# Patient Record
Sex: Male | Born: 1955 | Race: White | Hispanic: No | Marital: Married
Health system: Southern US, Community
[De-identification: ages and names within clinical notes are randomized; demographics above are authoritative.]

---

## 2021-05-16 ENCOUNTER — Emergency Department (HOSPITAL_COMMUNITY): Payer: Medicare Other

## 2021-05-16 ENCOUNTER — Encounter (HOSPITAL_COMMUNITY): Payer: Self-pay | Admitting: Emergency Medicine

## 2021-05-16 ENCOUNTER — Other Ambulatory Visit: Payer: Self-pay

## 2021-05-16 ENCOUNTER — Emergency Department (HOSPITAL_COMMUNITY)
Admission: EM | Admit: 2021-05-16 | Discharge: 2021-05-16 | Disposition: A | Discharge status: Home / Self Care | Payer: Medicare Other | Attending: Emergency Medicine | Admitting: Emergency Medicine

## 2021-05-16 DIAGNOSIS — Z7901 Long term (current) use of anticoagulants: Secondary | ICD-10-CM | POA: Insufficient documentation

## 2021-05-16 DIAGNOSIS — S0990XA Unspecified injury of head, initial encounter: Secondary | ICD-10-CM | POA: Diagnosis not present

## 2021-05-16 DIAGNOSIS — R413 Other amnesia: Secondary | ICD-10-CM | POA: Diagnosis not present

## 2021-05-16 DIAGNOSIS — R55 Syncope and collapse: Secondary | ICD-10-CM | POA: Diagnosis not present

## 2021-05-16 DIAGNOSIS — Y900 Blood alcohol level of less than 20 mg/100 ml: Secondary | ICD-10-CM | POA: Insufficient documentation

## 2021-05-16 DIAGNOSIS — R079 Chest pain, unspecified: Secondary | ICD-10-CM | POA: Diagnosis not present

## 2021-05-16 DIAGNOSIS — Z79899 Other long term (current) drug therapy: Secondary | ICD-10-CM | POA: Diagnosis not present

## 2021-05-16 DIAGNOSIS — R519 Headache, unspecified: Secondary | ICD-10-CM | POA: Diagnosis not present

## 2021-05-16 DIAGNOSIS — R0789 Other chest pain: Secondary | ICD-10-CM | POA: Diagnosis not present

## 2021-05-16 DIAGNOSIS — W19XXXA Unspecified fall, initial encounter: Secondary | ICD-10-CM

## 2021-05-16 DIAGNOSIS — Y92009 Unspecified place in unspecified non-institutional (private) residence as the place of occurrence of the external cause: Secondary | ICD-10-CM | POA: Diagnosis not present

## 2021-05-16 DIAGNOSIS — Z20822 Contact with and (suspected) exposure to covid-19: Secondary | ICD-10-CM | POA: Diagnosis not present

## 2021-05-16 DIAGNOSIS — W11XXXA Fall on and from ladder, initial encounter: Secondary | ICD-10-CM | POA: Insufficient documentation

## 2021-05-16 DIAGNOSIS — S3991XA Unspecified injury of abdomen, initial encounter: Secondary | ICD-10-CM | POA: Diagnosis not present

## 2021-05-16 LAB — CBC
HCT: 42.7 % (ref 39.0–52.0)
Hemoglobin: 14.3 g/dL (ref 13.0–17.0)
MCH: 31.8 pg (ref 26.0–34.0)
MCHC: 33.5 g/dL (ref 30.0–36.0)
MCV: 94.9 fL (ref 80.0–100.0)
Platelets: 284 10*3/uL (ref 150–400)
RBC: 4.5 MIL/uL (ref 4.22–5.81)
RDW: 14 % (ref 11.5–15.5)
WBC: 9.6 10*3/uL (ref 4.0–10.5)
nRBC: 0 % (ref 0.0–0.2)

## 2021-05-16 LAB — I-STAT CHEM 8, ED
BUN: 22 mg/dL (ref 8–23)
Calcium, Ion: 1.16 mmol/L (ref 1.15–1.40)
Chloride: 100 mmol/L (ref 98–111)
Creatinine, Ser: 1.1 mg/dL (ref 0.61–1.24)
Glucose, Bld: 112 mg/dL — ABNORMAL HIGH (ref 70–99)
HCT: 43 % (ref 39.0–52.0)
Hemoglobin: 14.6 g/dL (ref 13.0–17.0)
Potassium: 3.6 mmol/L (ref 3.5–5.1)
Sodium: 138 mmol/L (ref 135–145)
TCO2: 30 mmol/L (ref 22–32)

## 2021-05-16 LAB — ETHANOL: Alcohol, Ethyl (B): <10 mg/dL (ref <10)

## 2021-05-16 LAB — RESP PANEL BY RT-PCR (FLU A&B, COVID) ARPGX2
Influenza A by PCR: NEGATIVE
Influenza B by PCR: NEGATIVE
SARS Coronavirus 2 by RT PCR: NEGATIVE

## 2021-05-16 LAB — PROTIME-INR
INR: 1.3 — ABNORMAL HIGH (ref 0.8–1.2)
Prothrombin Time: 15.7 seconds — ABNORMAL HIGH (ref 11.4–15.2)

## 2021-05-16 LAB — SAMPLE TO BLOOD BANK

## 2021-05-16 LAB — COMPREHENSIVE METABOLIC PANEL
ALT: 35 U/L (ref 0–44)
AST: 36 U/L (ref 15–41)
Albumin: 3.9 g/dL (ref 3.5–5.0)
Alkaline Phosphatase: 67 U/L (ref 38–126)
Anion gap: 10 (ref 5–15)
BUN: 20 mg/dL (ref 8–23)
CO2: 26 mmol/L (ref 22–32)
Calcium: 9.4 mg/dL (ref 8.9–10.3)
Chloride: 100 mmol/L (ref 98–111)
Creatinine, Ser: 1.21 mg/dL (ref 0.61–1.24)
GFR, Estimated: >60 mL/min (ref >60)
Glucose, Bld: 112 mg/dL — ABNORMAL HIGH (ref 70–99)
Potassium: 3.8 mmol/L (ref 3.5–5.1)
Sodium: 136 mmol/L (ref 135–145)
Total Bilirubin: 1 mg/dL (ref 0.3–1.2)
Total Protein: 6.8 g/dL (ref 6.5–8.1)

## 2021-05-16 LAB — LACTIC ACID, PLASMA: Lactic Acid, Venous: 1.5 mmol/L (ref 0.5–1.9)

## 2021-05-16 MED ORDER — CYCLOBENZAPRINE HCL 5 MG PO TABS: 5.0000 mg | ORAL_TABLET | Freq: Three times a day (TID) | ORAL | 0 refills | Status: AC | PRN

## 2021-05-16 MED ORDER — IOHEXOL 300 MG/ML  SOLN
100.0000 mL | Freq: Once | INTRAMUSCULAR | Status: AC | PRN
  Administered 2021-05-16: 100 mL via INTRAVENOUS

## 2021-05-16 MED ORDER — OXYCODONE-ACETAMINOPHEN 5-325 MG PO TABS
2.0000 | ORAL_TABLET | Freq: Once | ORAL | Status: AC
Start: 2021-05-16 — End: 2021-05-16
  Administered 2021-05-16: 2 via ORAL
  Filled 2021-05-16: qty 2

## 2021-05-16 MED ORDER — ONDANSETRON 4 MG PO TBDP
4.0000 mg | ORAL_TABLET | Freq: Once | ORAL | Status: AC
  Administered 2021-05-16: 4 mg via ORAL
  Filled 2021-05-16: qty 1

## 2021-05-16 NOTE — ED Notes (Signed)
RN called CT, waiting istat results, no scanner available.

## 2021-05-16 NOTE — ED Notes (Signed)
X-ray at bedside

## 2021-05-16 NOTE — ED Provider Notes (Addendum)
[facility] EMERGENCY DEPARTMENT Provider Note   CSN: [id number] Arrival date & time: 05/16/21  1421     History Chief Complaint  Patient presents with   Cesareo Vickrey is a 65 y.o. male.  HPI  65 year old male who is reported to be on anticoagulation presents emergency department as a level 2 fall from a 6 feet off of a ladder.  He reportedly landed on his right side over a fence and then fell to the ground.  Positive head injury, positive reported loss of consciousness.  Patient has amnesia in regards to the event.  Complaining of right-sided head and right-sided chest pain.  History reviewed. No pertinent past medical history.  There are no problems to display for this patient.   History reviewed. No pertinent surgical history.     History reviewed. No pertinent family history.  Social History   Substance Use Topics   Alcohol use: Not Currently   Drug use: Never    Home Medications Prior to Admission medications   Not on File    Allergies    Patient has no known allergies.  Review of Systems   Review of Systems  HENT:  Negative for trouble swallowing and voice change.   Eyes:  Negative for visual disturbance.  Respiratory:  Negative for shortness of breath.   Cardiovascular:  Positive for chest pain.  Gastrointestinal:  Negative for abdominal pain.  Genitourinary:  Negative for difficulty urinating.  Musculoskeletal:  Positive for neck pain. Negative for back pain.  Neurological:  Positive for headaches.  Psychiatric/Behavioral:  Negative for confusion.    Physical Exam Updated Vital Signs BP 119/66   Pulse 72   Temp 98.5 F (36.9 C) (Temporal)   Resp 20   Ht 6' (1.829 m)   Wt 102.1 kg   SpO2 93%   BMI 30.52 kg/m   Physical Exam Vitals and nursing note reviewed.  Constitutional:      General: He is not in acute distress. HENT:     Head: Normocephalic.     Comments: Midface is stable    Right Ear: External ear  normal.     Left Ear: External ear normal.     Nose: Nose normal.     Comments: No septal hematoma Eyes:     Extraocular Movements: Extraocular movements intact.     Conjunctiva/sclera: Conjunctivae normal.     Pupils: Pupils are equal, round, and reactive to light.  Neck:     Comments: Cervical collar in place Cardiovascular:     Rate and Rhythm: Normal rate.  Pulmonary:     Effort: Pulmonary effort is normal.  Abdominal:     General: Abdomen is flat. There is no distension.     Palpations: Abdomen is soft.     Comments: No seat belt sign  Musculoskeletal:        General: No deformity or signs of injury.     Cervical back: No tenderness.     Comments: Pelvis is stable  Skin:    General: Skin is warm.  Neurological:     Mental Status: He is alert and oriented to person, place, and time.    ED Results / Procedures / Treatments   Labs (all labs ordered are listed, but only abnormal results are displayed) Labs Reviewed  I-STAT CHEM 8, ED - Abnormal; Notable for the following components:      Result Value   Glucose, Bld 112 (*)    All other  components within normal limits  RESP PANEL BY RT-PCR (FLU A&B, COVID) ARPGX2  LACTIC ACID, PLASMA  COMPREHENSIVE METABOLIC PANEL  CBC  ETHANOL  URINALYSIS, ROUTINE W REFLEX MICROSCOPIC  PROTIME-INR  SAMPLE TO BLOOD BANK    EKG None  Radiology DG Chest Port 1 View  Result Date: 05/16/2021 CLINICAL DATA:  Level 2 trauma.  Fell from a ladder. EXAM: PORTABLE CHEST 1 VIEW COMPARISON:  None. FINDINGS: Artifact overlies the chest. Heart size is normal. Allowing for technical factors, the lungs are probably clear. No pneumothorax or hemothorax. No visible regional fracture. IMPRESSION: Technical limitations.  No traumatic finding. Electronically Signed   By: Paulina Fusi M.D.   On: 05/16/2021 15:09    Procedures Ultrasound ED FAST  Date/Time: 05/16/2021 3:13 PM Performed by: Rozelle Logan, DO Authorized by: Rozelle Logan, DO   Procedure details:    Indications: blunt abdominal trauma and blunt chest trauma       Assess for:  Intra-abdominal fluid, pericardial effusion and pneumothorax    Technique:  Abdominal, cardiac and chest    Images: archived      Abdominal findings:    L kidney:  Visualized   R kidney:  Visualized   Liver:  Visualized    Bladder:  Visualized, Foley catheter not visualized   Hepatorenal space visualized: identified     Splenorenal space: identified     Rectovesical free fluid: not identified     Splenorenal free fluid: not identified     Hepatorenal space free fluid: not identified   Cardiac findings:    Heart:  Visualized   Wall motion: identified     Pericardial effusion: not identified   Chest findings:    L lung sliding: identified     R lung sliding: identified     Fluid in thorax: not identified   .Critical Care  Date/Time: 05/16/2021 3:13 PM Performed by: Rozelle Logan, DO Authorized by: Rozelle Logan, DO   Critical care provider statement:    Critical care time (minutes):  45   Critical care was necessary to treat or prevent imminent or life-threatening deterioration of the following conditions:  Trauma   Critical care was time spent personally by me on the following activities:  Discussions with consultants, evaluation of patient's response to treatment, examination of patient, ordering and performing treatments and interventions, ordering and review of laboratory studies, ordering and review of radiographic studies, pulse oximetry, re-evaluation of patient's condition, obtaining history from patient or surrogate and review of old charts   I assumed direction of critical care for this patient from another provider in my specialty: no     Medications Ordered in ED Medications - No data to display  ED Course  I have reviewed the triage vital signs and the nursing notes.  Pertinent labs & imaging results that were available during my care of the patient were  reviewed by me and considered in my medical decision making (see chart for details).    MDM Rules/Calculators/A&P                           65 year old male presents to the emergency department as a level 2 trauma for fall on blood thinners.  Report is that patient was up on a ladder, fell approximately 6 feet down onto his right side on a fence, positive head injury, positive loss of consciousness and amnesia.  Turns out the patient does not take any anticoagulation.  Vital signs are stable, fast sign is negative.  Chest and pelvis x-ray showed no acute pathology.  Ankle x-ray shows no fracture.  CT of the head, face, neck, chest abdomen pelvis identifies no acute traumatic injury.  I was able to clear the patient's C-spine at bedside.  Patient will be ambulated and discharged.  Patient at this time appears safe and stable for discharge and will be treated as an outpatient.  Discharge plan and strict return to ED precautions discussed, patient verbalizes understanding and agreement.  Final Clinical Impression(s) / ED Diagnoses Final diagnoses:  Fall    Rx / DC Orders ED Discharge Orders     None        Rozelle Logan, DO 05/16/21 1514    Hidaya Daniel, Clabe Seal, DO 05/16/21 1603

## 2021-05-16 NOTE — ED Notes (Signed)
RN called CT, scanner not available right now

## 2021-05-16 NOTE — Progress Notes (Signed)
Orthopedic Tech Progress Note Patient Details:  Davione Lenker Aug 27, 1956 [id number] Level 2 Trauma  Patient ID: Adalberto Ill, male   DOB: 09/27/1956, 65 y.o.   MRN: [id number]  Smitty Pluck 05/16/2021, 2:39 PM

## 2021-05-16 NOTE — Discharge Instructions (Addendum)
You have been seen and discharged from the emergency department.  Your x-rays and CAT scans were negative.  You are going to be sore.  Rest, take Tylenol/ibuprofen as needed for pain control.  Ice sore areas.  Follow-up with your primary provider for reevaluation and further care. Take home medications as prescribed. If you have any worsening symptoms or further concerns for your health please return to an emergency department for further evaluation.

## 2021-05-16 NOTE — ED Triage Notes (Signed)
Pt arrives as level 2 trauma for fall from 6-8 ft ladder via [facility]. Pt was at daughter's house, had witnessed fall off ladder, landed on a baby gate and hardwood onto R side, + LOC. Initial GCS 14, pt only alert to self. VSS, 18g LAC

## 2022-07-23 IMAGING — DX DG ANKLE PORT 2V*R*
3 series · 3 of 3 positions shown · non-contrast
Comparison: None.

CLINICAL DATA: Fell from a ladder.  Ankle pain.

EXAM:
PORTABLE RIGHT ANKLE - 2 VIEW

[ankle ap]
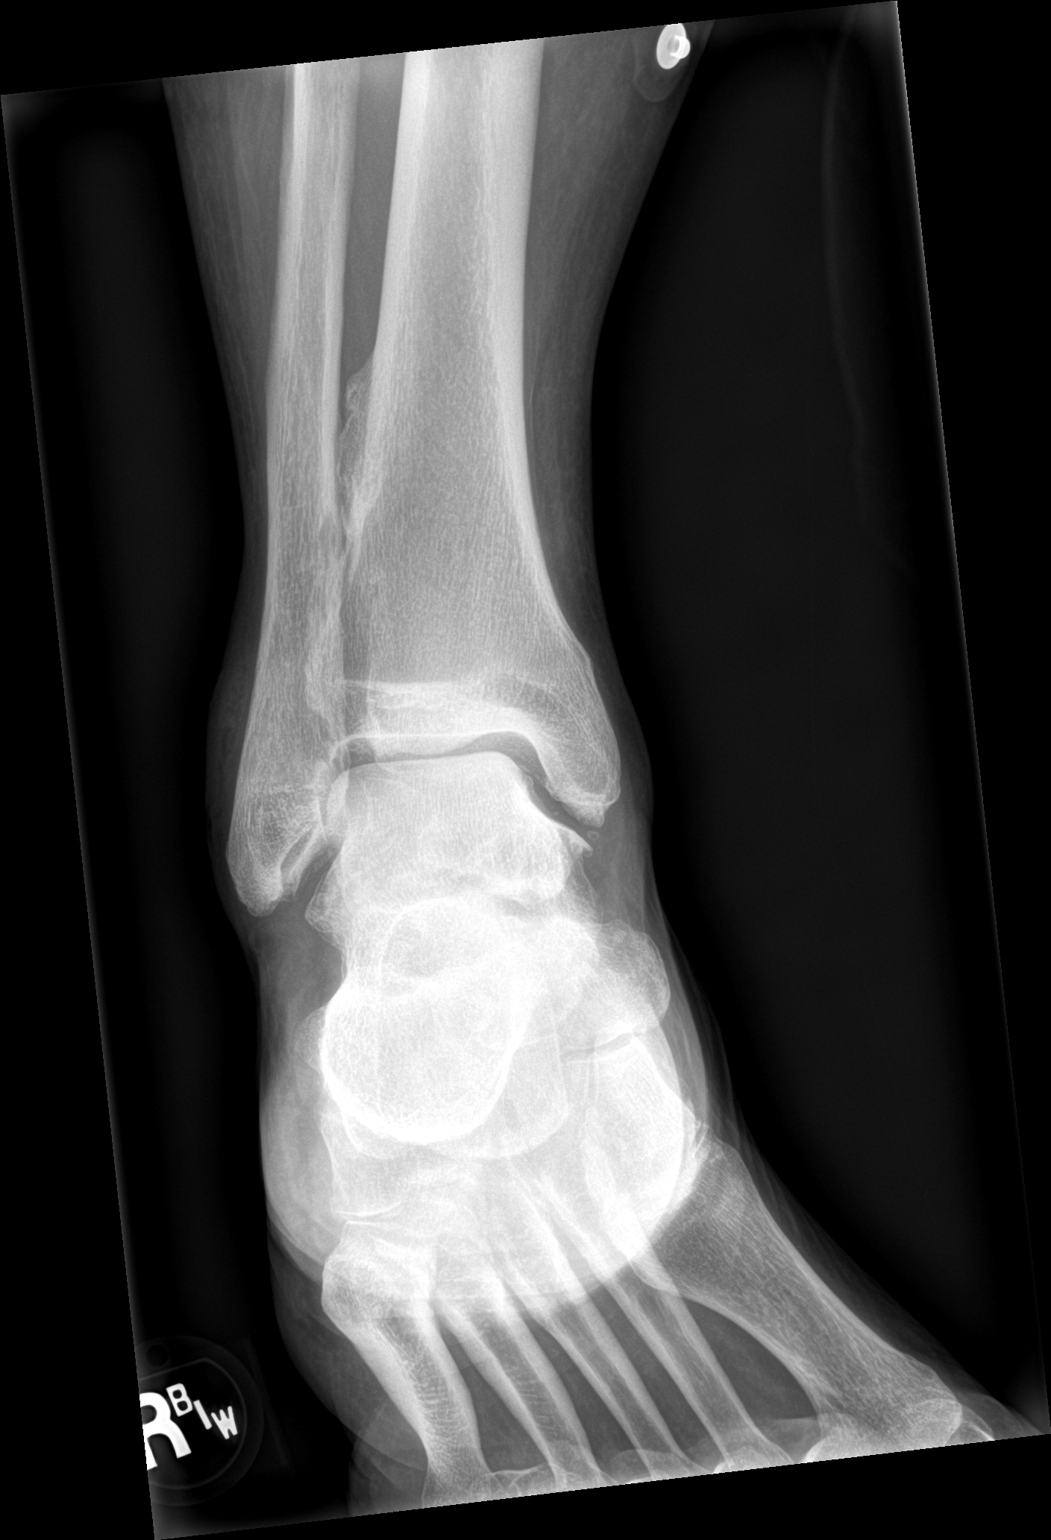

[ankle obl]
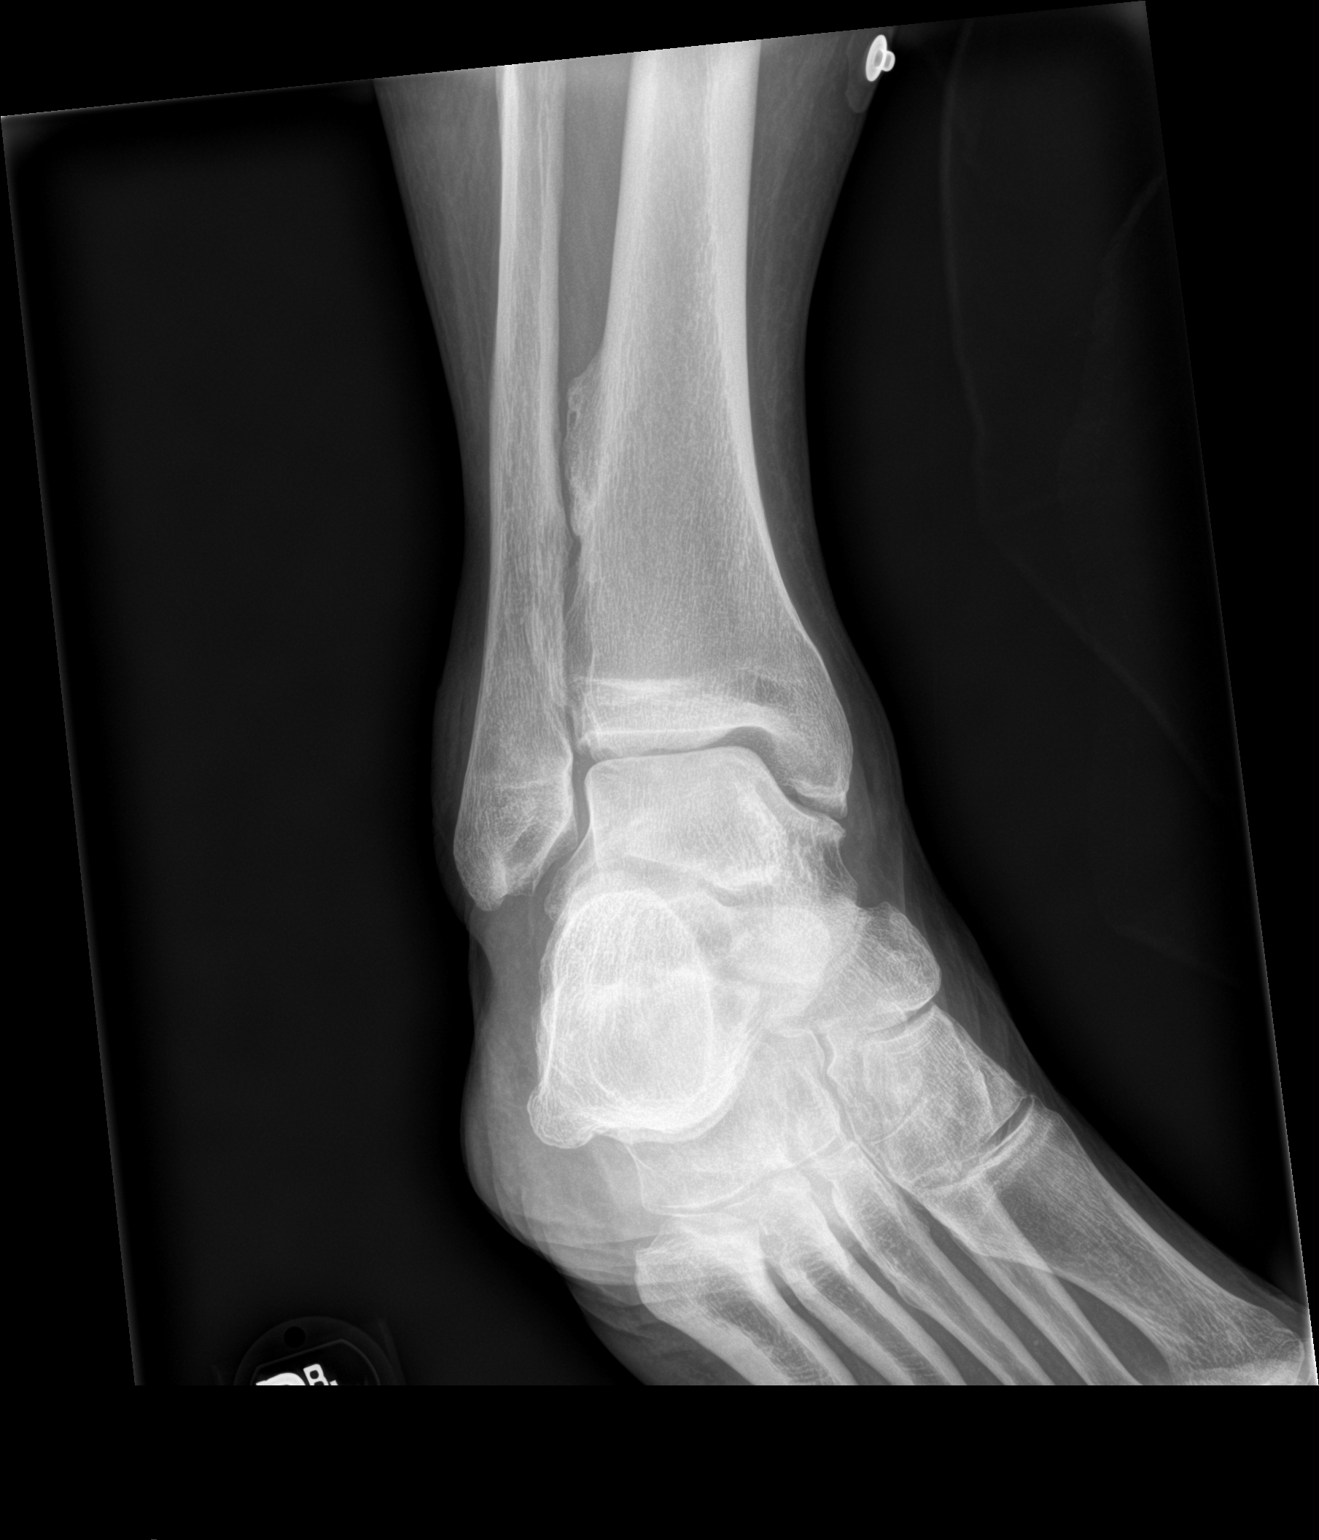

[ankle lat]
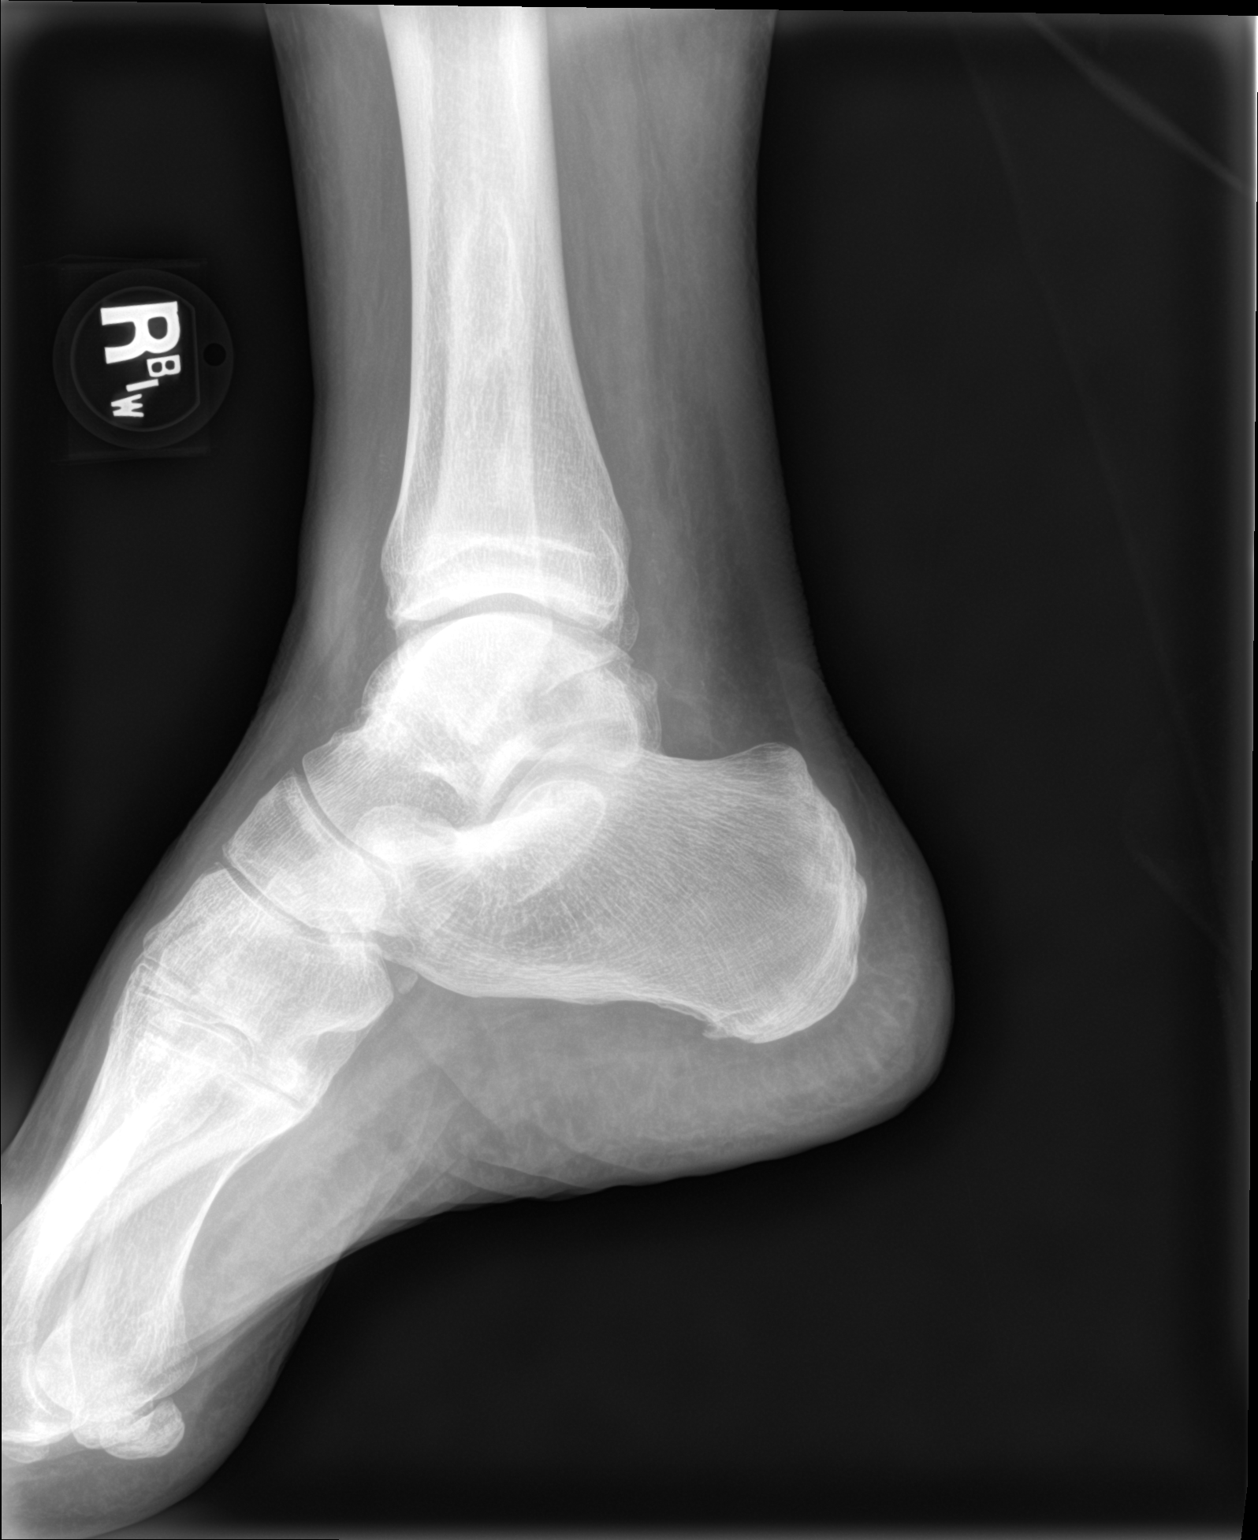

[3 of 3 positions shown; findings below may reference images not displayed]

FINDINGS: Nonspecific soft tissue swelling. No evidence of fracture or
dislocation.
IMPRESSION: Nonspecific soft tissue swelling.  No acute bone or joint finding.

## 2022-07-23 IMAGING — DX DG CHEST 1V PORT
1 series · 1 of 1 positions shown · non-contrast
Comparison: None.

CLINICAL DATA: Level 2 trauma.  Fell from a ladder.

EXAM:
PORTABLE CHEST 1 VIEW

[chest ap]
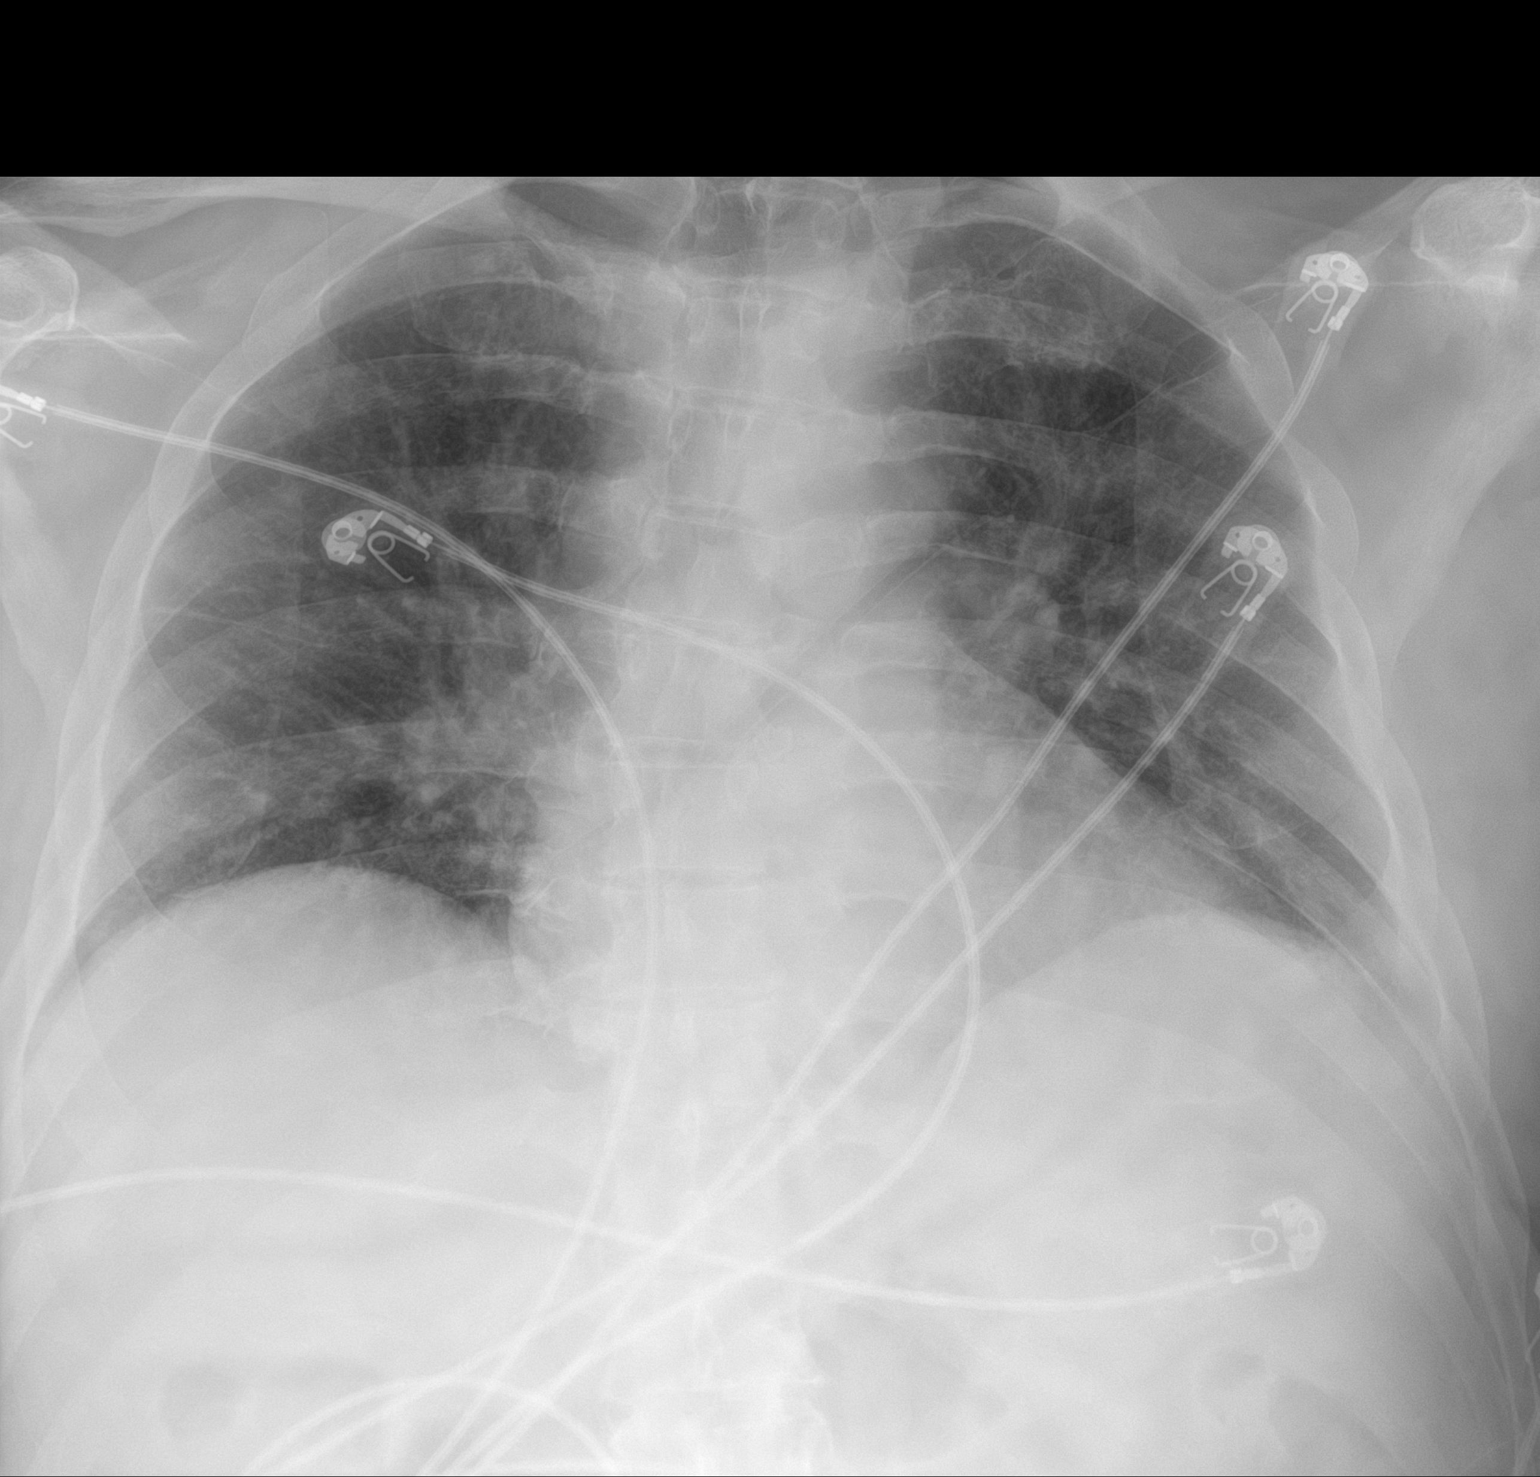

[1 of 1 positions shown; findings below may reference images not displayed]

FINDINGS: Artifact overlies the chest. Heart size is normal. Allowing for
technical factors, the lungs are probably clear. No pneumothorax or
hemothorax. No visible regional fracture.
IMPRESSION: Technical limitations.  No traumatic finding.
